# Patient Record
Sex: Male | Born: 2000 | Race: Black or African American | Hispanic: No | Marital: Single
Health system: Southern US, Community
[De-identification: ages and names within clinical notes are randomized; demographics above are authoritative.]

## PROBLEM LIST (undated history)

## (undated) DIAGNOSIS — F329 Major depressive disorder, single episode, unspecified: Secondary | ICD-10-CM

## (undated) DIAGNOSIS — F32A Depression, unspecified: Secondary | ICD-10-CM

## (undated) DIAGNOSIS — F913 Oppositional defiant disorder: Secondary | ICD-10-CM

## (undated) DIAGNOSIS — C419 Malignant neoplasm of bone and articular cartilage, unspecified: Secondary | ICD-10-CM

## (undated) DIAGNOSIS — C801 Malignant (primary) neoplasm, unspecified: Secondary | ICD-10-CM

## (undated) HISTORY — PX: LEG SURGERY: SHX1003

## (undated) HISTORY — PX: PARTIAL HIP ARTHROPLASTY: SHX733

---
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
## 2018-01-24 IMAGING — CT CT HEAD W/O CM
3 series · 15 of 47 positions shown, 18 images · non-contrast
Comparison: None.

CLINICAL DATA: Altered mental status.  Possible drug overdose.

EXAM:
CT HEAD WITHOUT CONTRAST
TECHNIQUE: Contiguous axial images were obtained from the base of the skull
through the vertex without intravenous contrast.

[Series 2: head wo · axial · 0.43mm/px · z∈[-153,-18]mm · 9 of 33 slices shown, 12 images]
[im 3/33  brain]
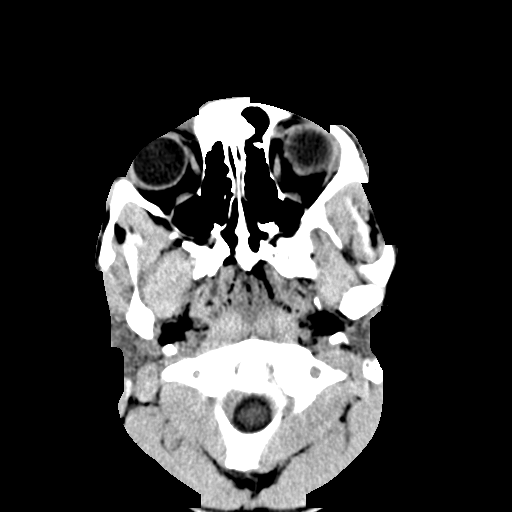
[im 3/33  bone]
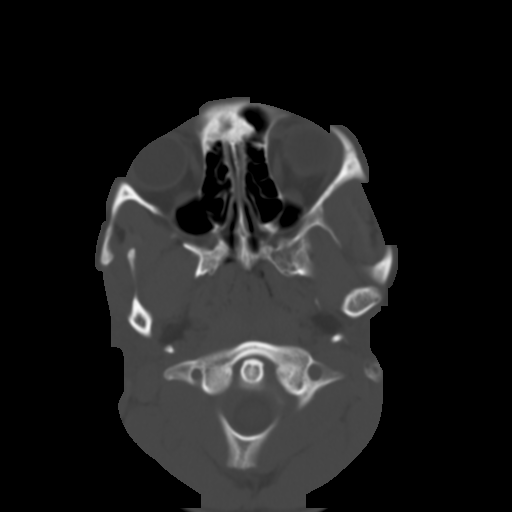
[im 6/33  brain]
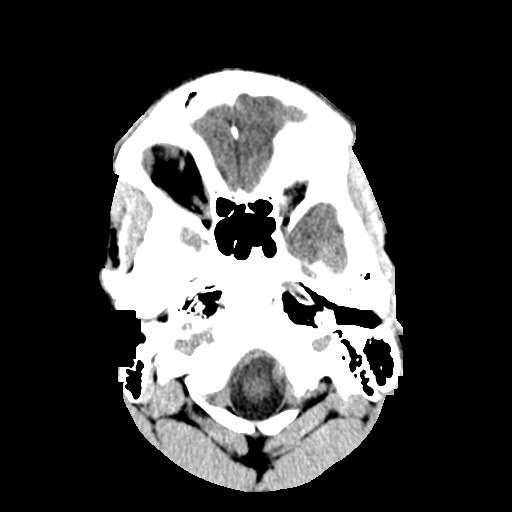
[im 9/33  brain]
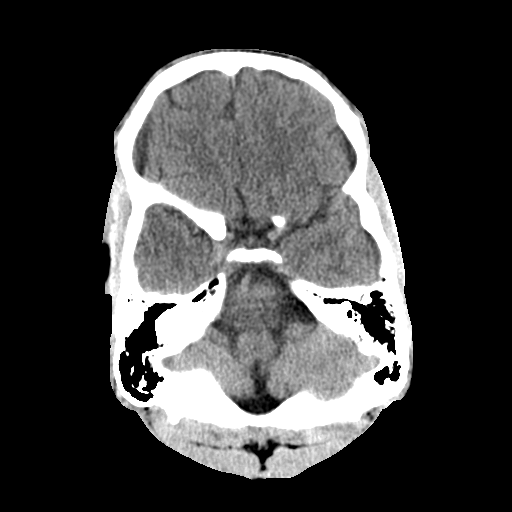
[im 13/33  brain]
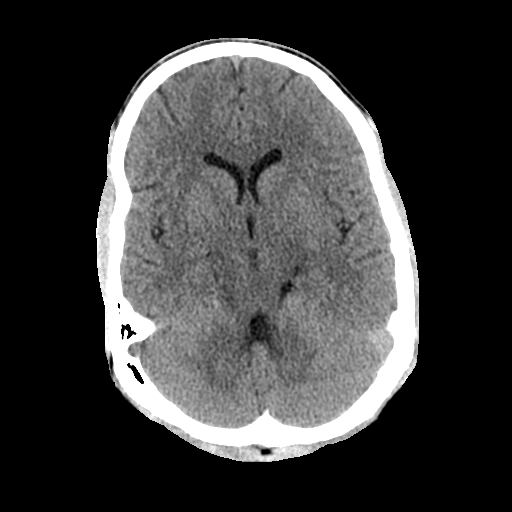
[im 17/33  brain]
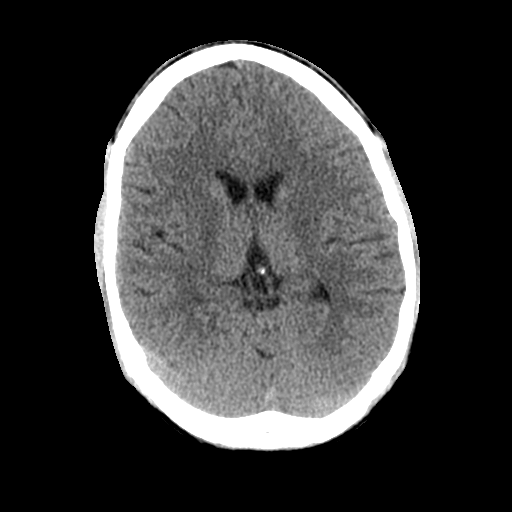
[im 17/33  bone]
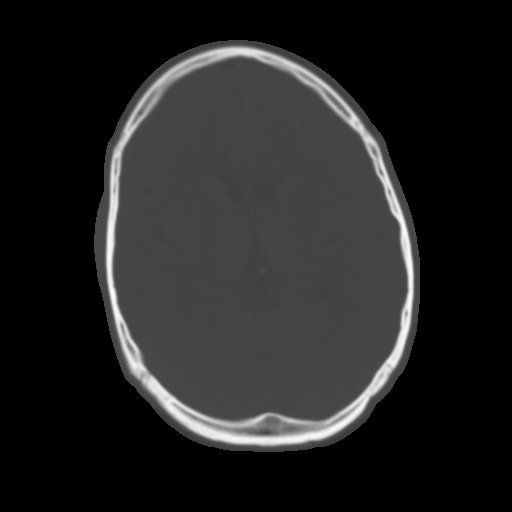
[im 20/33  brain]
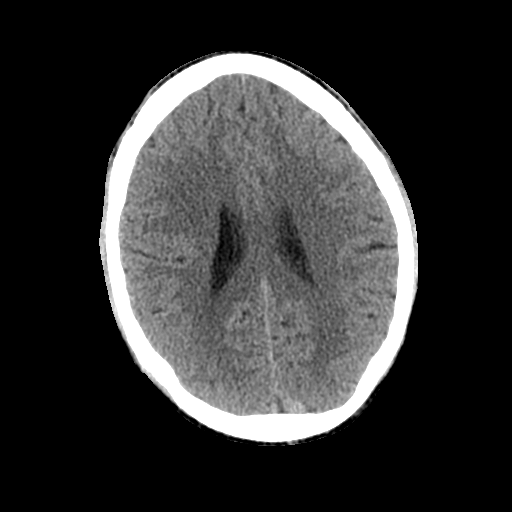
[im 24/33  brain]
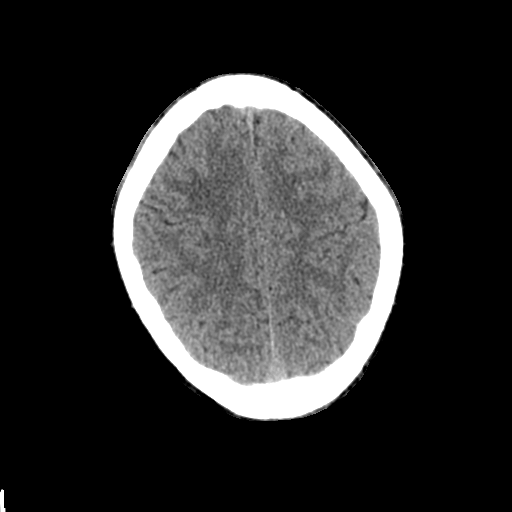
[im 27/33  brain]
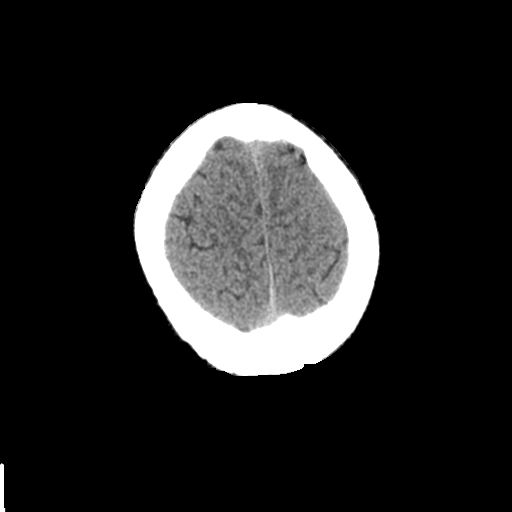
[im 30/33  brain]
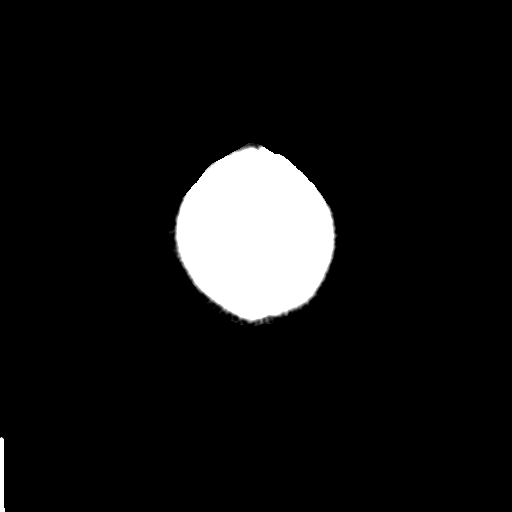
[im 30/33  bone]
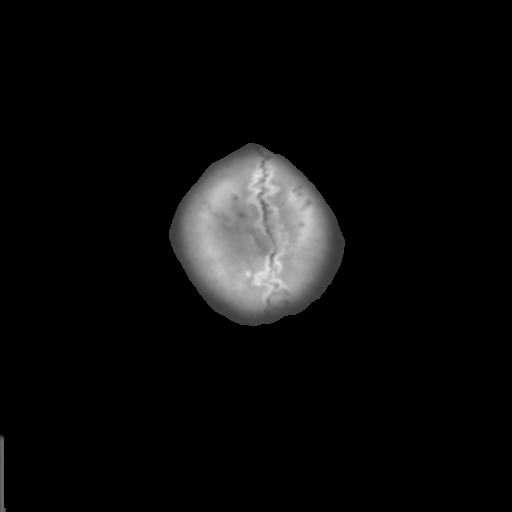

[Series 4: coronal soft tissue · coronal · 0.35mm/px · 3 of 64 slices shown]
[im 22/64  brain]
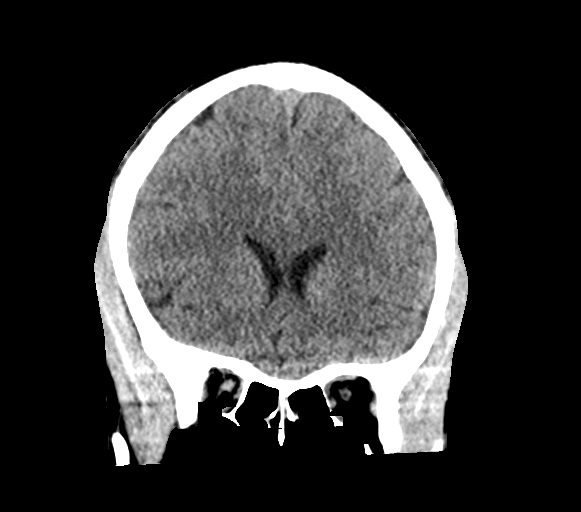
[im 29/64  brain]
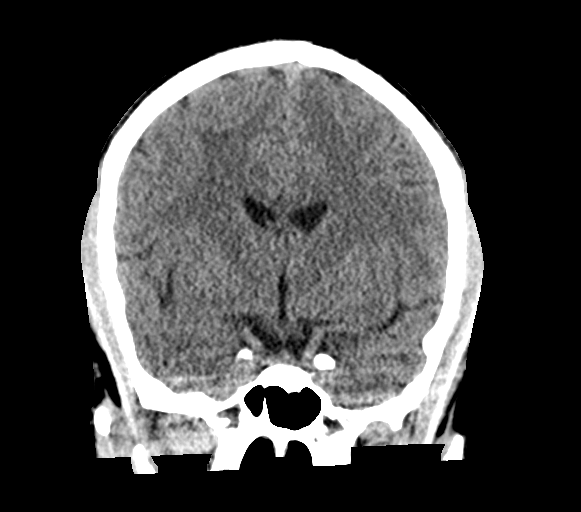
[im 36/64  brain]
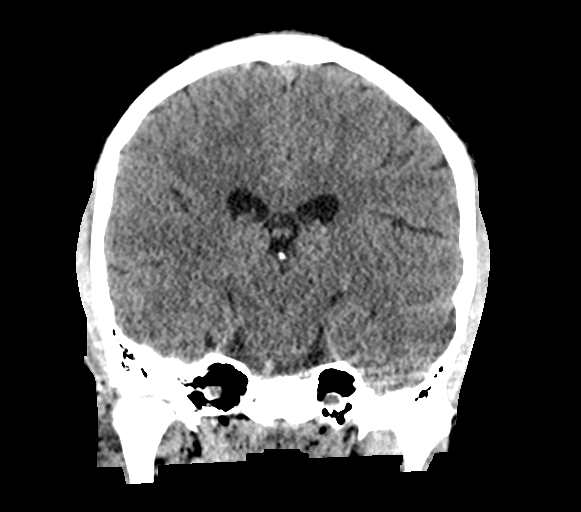

[Series 5: sagittal soft tissue · sagittal · 0.34mm/px · 3 of 55 slices shown]
[im 19/55  brain]
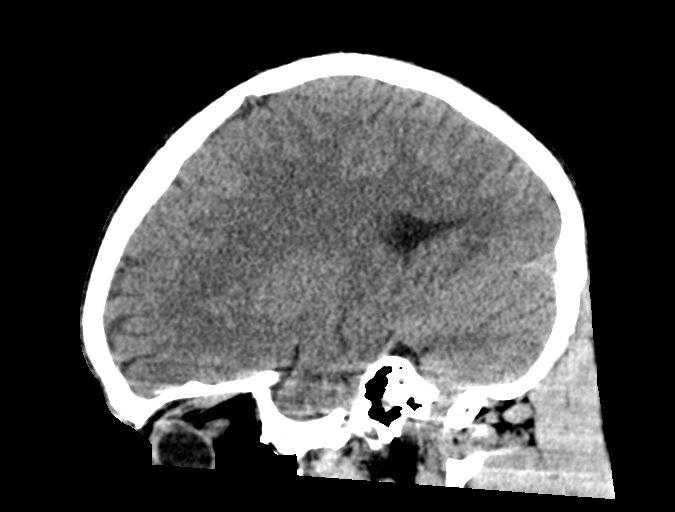
[im 28/55  brain]
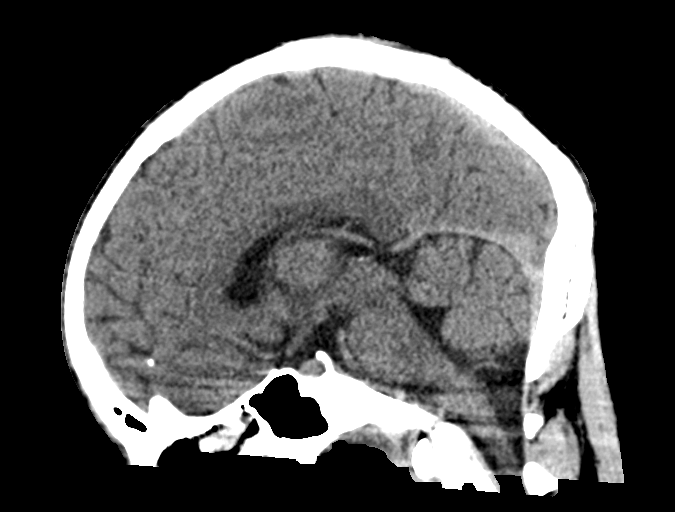
[im 37/55  brain]
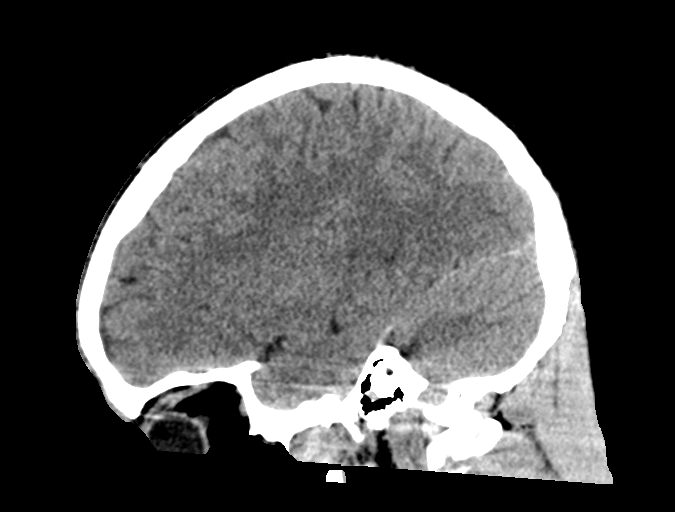

[15 of 47 positions shown; findings below may reference images not displayed]

FINDINGS: Brain: No evidence of acute infarction, hemorrhage, hydrocephalus,
extra-axial collection or mass lesion. Lateral anterior cranial
fossa low-density, more prominent on the right, is considered
artifactual. No extra-axial collection suspected.

Vascular: No hyperdense vessel or unexpected calcification.

Skull: Normal. Negative for fracture or focal lesion.

Sinuses/Orbits: Negative.
IMPRESSION: Negative head CT.

## 2018-01-24 IMAGING — DX DG CHEST 1V PORT
1 series · 1 of 1 positions shown · non-contrast
Comparison: No recent prior.

CLINICAL DATA: Altered mental status.

EXAM:
PORTABLE CHEST 1 VIEW

[chest ap]
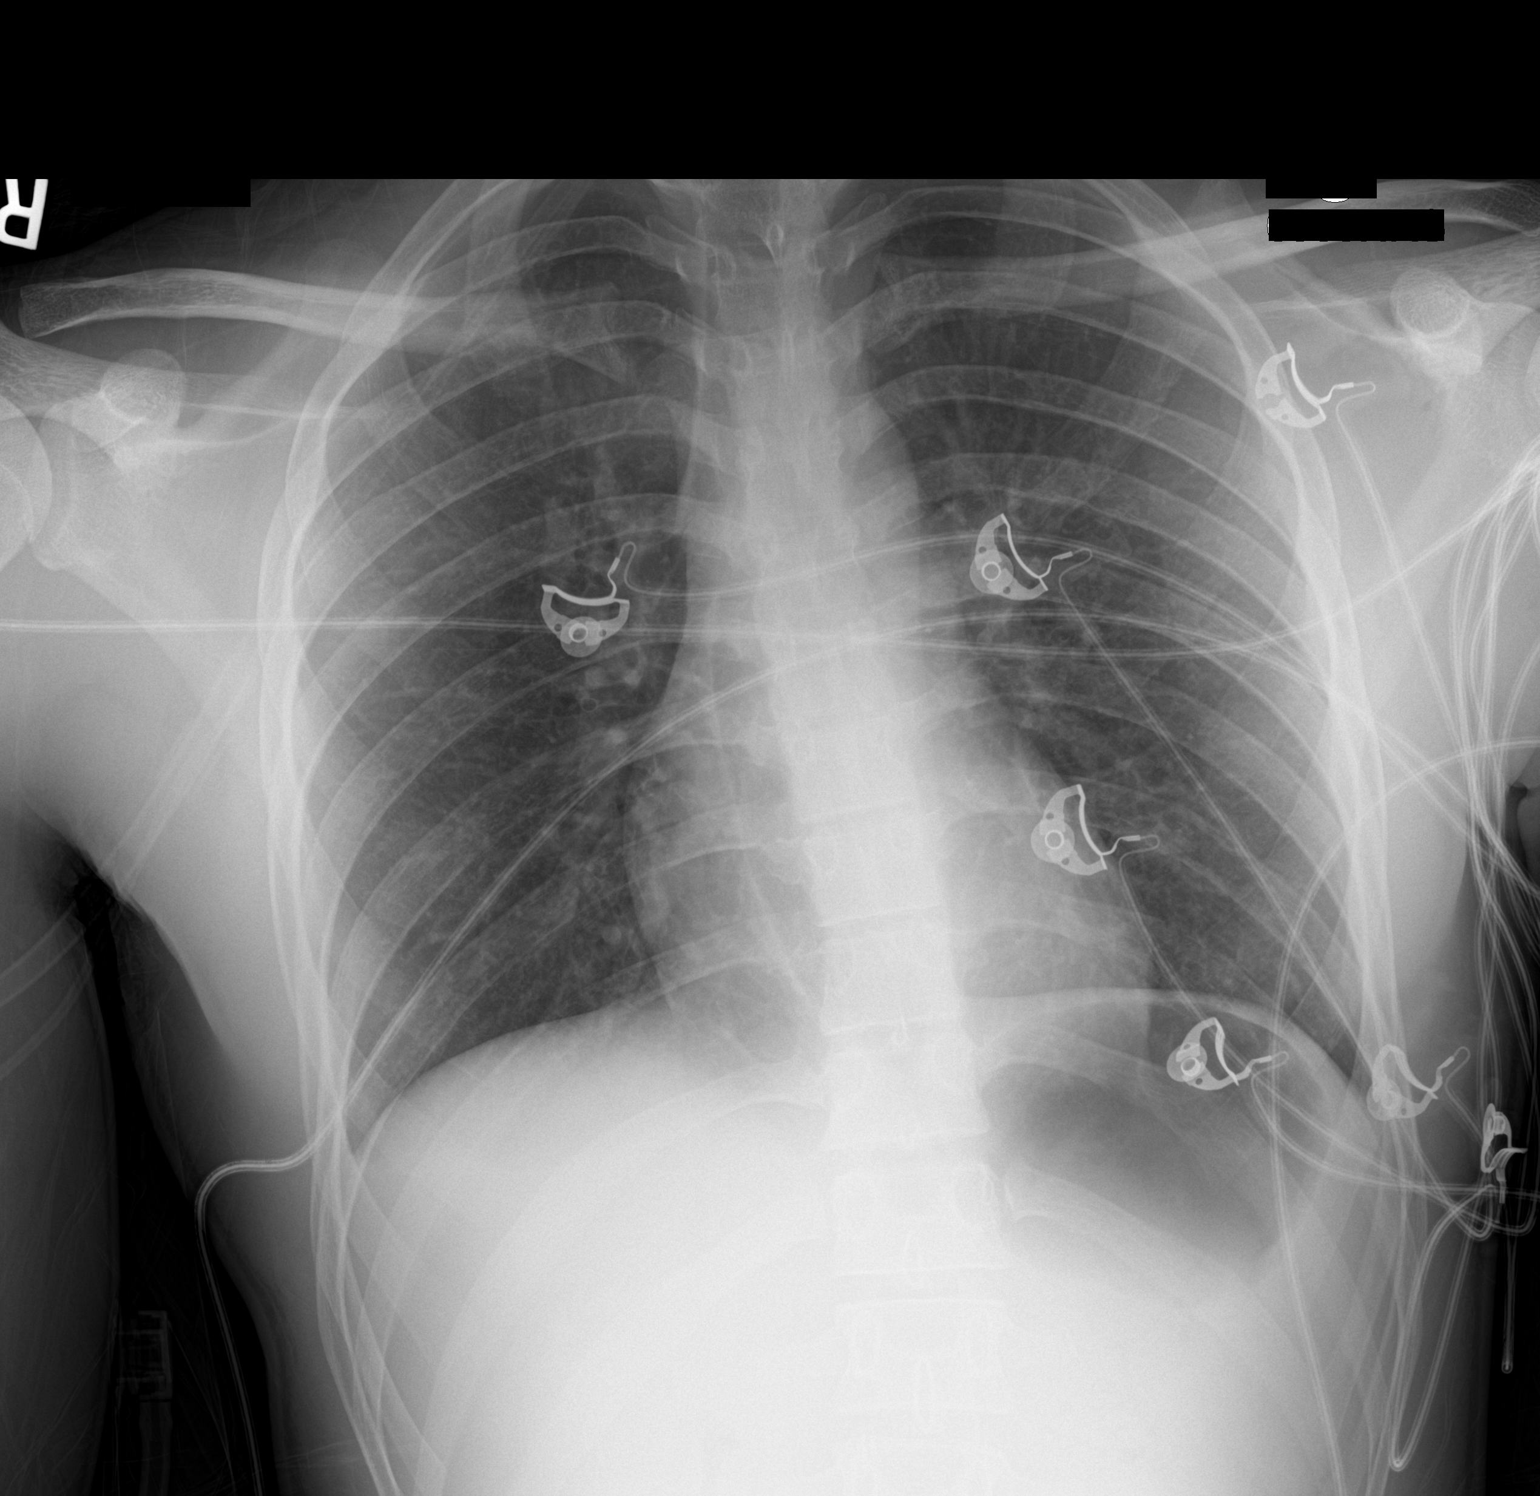

[1 of 1 positions shown; findings below may reference images not displayed]

FINDINGS: Linear density noted over the medial right chest. This may represent
pleural thickening. A process such is a distended esophagus cannot
be completely excluded. Follow-up PA and lateral chest x-ray
suggested. Heart size normal. Right suprahilar density noted, most
likely related EKG pain. This can be be further evaluated on
follow-up exam No prominent pleural effusion. No pneumothorax. No
acute bony abnormality. Mild gastric distention.
IMPRESSION: 1. Linear density is noted along the right medial chest. This may
represent pleural thickening. A process such as a distended
esophagus cannot be completely excluded. Follow-up PA lateral chest
x-ray suggested.

2. Mild gastric distention .

## 2019-04-04 ENCOUNTER — Ambulatory Visit: Payer: Medicaid Other | Attending: Internal Medicine
